# Patient Record
Sex: Female | Born: 1980 | Race: Black or African American | Hispanic: No | Marital: Married | Smoking: Never smoker
Health system: Southern US, Community
[De-identification: ages and names within clinical notes are randomized; demographics above are authoritative.]

## PROBLEM LIST (undated history)

## (undated) DIAGNOSIS — K831 Obstruction of bile duct: Secondary | ICD-10-CM

## (undated) DIAGNOSIS — O26619 Liver and biliary tract disorders in pregnancy, unspecified trimester: Secondary | ICD-10-CM

## (undated) HISTORY — DX: Obstruction of bile duct: K83.1

## (undated) HISTORY — DX: Liver and biliary tract disorders in pregnancy, unspecified trimester: O26.619

---

## 1999-03-12 ENCOUNTER — Other Ambulatory Visit: Admission: RE | Admit: 1999-03-12 | Discharge: 1999-03-12 | Payer: Self-pay | Admitting: Family Medicine

## 2000-11-05 ENCOUNTER — Encounter: Payer: Self-pay | Admitting: Infectious Diseases

## 2000-11-05 ENCOUNTER — Ambulatory Visit (HOSPITAL_COMMUNITY): Admission: RE | Admit: 2000-11-05 | Discharge: 2000-11-05 | Payer: Self-pay | Admitting: Infectious Diseases

## 2003-02-23 ENCOUNTER — Other Ambulatory Visit: Admission: RE | Admit: 2003-02-23 | Discharge: 2003-02-23 | Payer: Self-pay | Admitting: Family Medicine

## 2004-02-19 ENCOUNTER — Other Ambulatory Visit: Admission: RE | Admit: 2004-02-19 | Discharge: 2004-02-19 | Payer: Self-pay | Admitting: Obstetrics and Gynecology

## 2008-12-26 ENCOUNTER — Inpatient Hospital Stay (HOSPITAL_COMMUNITY): Admission: AD | Admit: 2008-12-26 | Discharge: 2008-12-26 | Payer: Self-pay | Admitting: Obstetrics and Gynecology

## 2009-01-26 HISTORY — PX: CHOLECYSTECTOMY: SHX55

## 2009-03-08 ENCOUNTER — Inpatient Hospital Stay (HOSPITAL_COMMUNITY): Admission: AD | Admit: 2009-03-08 | Discharge: 2009-03-10 | Payer: Self-pay | Admitting: Obstetrics and Gynecology

## 2009-08-20 ENCOUNTER — Encounter: Admission: RE | Admit: 2009-08-20 | Discharge: 2009-08-20 | Payer: Self-pay | Admitting: Family Medicine

## 2009-10-24 ENCOUNTER — Ambulatory Visit (HOSPITAL_COMMUNITY)
Admission: RE | Admit: 2009-10-24 | Discharge: 2009-10-24 | Payer: Self-pay | Source: Home / Self Care | Admitting: Gastroenterology

## 2009-12-03 ENCOUNTER — Ambulatory Visit (HOSPITAL_COMMUNITY)
Admission: RE | Admit: 2009-12-03 | Discharge: 2009-12-03 | Payer: Self-pay | Source: Home / Self Care | Admitting: General Surgery

## 2010-04-08 LAB — DIFFERENTIAL
Basophils Absolute: 0 10*3/uL (ref 0.0–0.1)
Lymphocytes Relative: 26 % (ref 12–46)
Monocytes Absolute: 0.4 10*3/uL (ref 0.1–1.0)
Neutro Abs: 5.5 10*3/uL (ref 1.7–7.7)
Neutrophils Relative %: 67 % (ref 43–77)

## 2010-04-08 LAB — COMPREHENSIVE METABOLIC PANEL
Albumin: 4.1 g/dL (ref 3.5–5.2)
BUN: 7 mg/dL (ref 6–23)
Creatinine, Ser: 0.69 mg/dL (ref 0.4–1.2)
Total Bilirubin: 0.4 mg/dL (ref 0.3–1.2)
Total Protein: 7.7 g/dL (ref 6.0–8.3)

## 2010-04-08 LAB — CBC
MCH: 28 pg (ref 26.0–34.0)
MCV: 83.9 fL (ref 78.0–100.0)
Platelets: 373 10*3/uL (ref 150–400)
RDW: 13.8 % (ref 11.5–15.5)

## 2010-04-08 LAB — SURGICAL PCR SCREEN: MRSA, PCR: NEGATIVE

## 2010-04-17 LAB — CBC
HCT: 34.6 % — ABNORMAL LOW (ref 36.0–46.0)
Hemoglobin: 11.3 g/dL — ABNORMAL LOW (ref 12.0–15.0)
MCV: 83.6 fL (ref 78.0–100.0)
RBC: 4.14 MIL/uL (ref 3.87–5.11)
RBC: 4.71 MIL/uL (ref 3.87–5.11)
RDW: 16.3 % — ABNORMAL HIGH (ref 11.5–15.5)
WBC: 11.8 10*3/uL — ABNORMAL HIGH (ref 4.0–10.5)
WBC: 13.2 10*3/uL — ABNORMAL HIGH (ref 4.0–10.5)

## 2010-04-17 LAB — RH IMMUNE GLOB WKUP(>/=20WKS)(NOT WOMEN'S HOSP): Fetal Screen: NEGATIVE

## 2010-04-17 LAB — RPR: RPR Ser Ql: NONREACTIVE

## 2010-04-29 LAB — DIFFERENTIAL
Basophils Absolute: 0 10*3/uL (ref 0.0–0.1)
Basophils Relative: 0 % (ref 0–1)
Eosinophils Absolute: 0 10*3/uL (ref 0.0–0.7)
Monocytes Relative: 6 % (ref 3–12)
Neutro Abs: 12.8 10*3/uL — ABNORMAL HIGH (ref 1.7–7.7)
Neutrophils Relative %: 85 % — ABNORMAL HIGH (ref 43–77)

## 2010-04-29 LAB — URINALYSIS, ROUTINE W REFLEX MICROSCOPIC
Bilirubin Urine: NEGATIVE
Ketones, ur: NEGATIVE mg/dL
Nitrite: NEGATIVE
Protein, ur: NEGATIVE mg/dL
Urobilinogen, UA: 0.2 mg/dL (ref 0.0–1.0)

## 2010-04-29 LAB — CBC
MCHC: 33.1 g/dL (ref 30.0–36.0)
MCV: 86.7 fL (ref 78.0–100.0)
RDW: 13.3 % (ref 11.5–15.5)

## 2010-05-15 ENCOUNTER — Other Ambulatory Visit: Payer: Self-pay | Admitting: Obstetrics and Gynecology

## 2011-08-20 ENCOUNTER — Other Ambulatory Visit: Payer: Self-pay | Admitting: Physician Assistant

## 2012-03-09 IMAGING — US US ABDOMEN COMPLETE
1 series · 14 of 25 positions shown · non-contrast
Comparison: None.

CLINICAL DATA: Elevated liver function tests, abdominal pain

COMPLETE ABDOMINAL ULTRASOUND

[Series 1: us abdomen complete · 0.25mm/px · 14 of 83 slices shown]
[im 1/83]
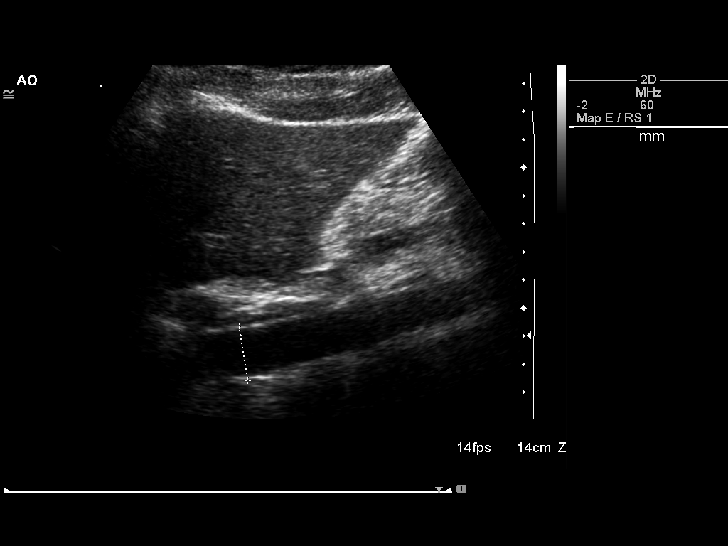
[im 7/83]
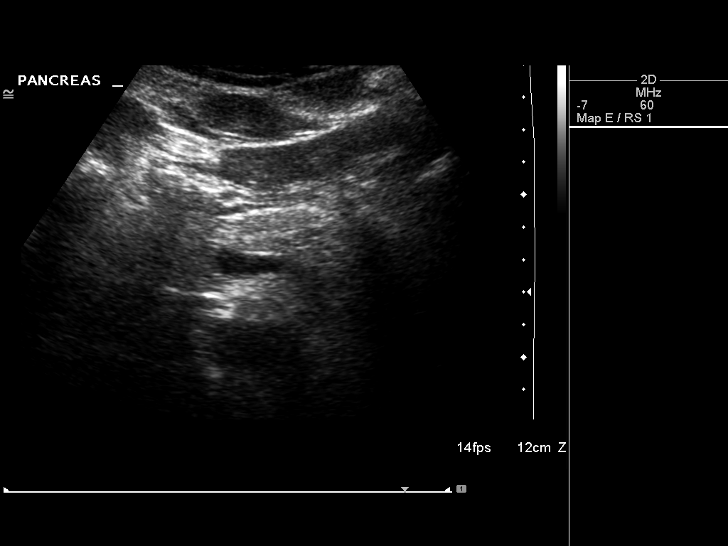
[im 14/83]
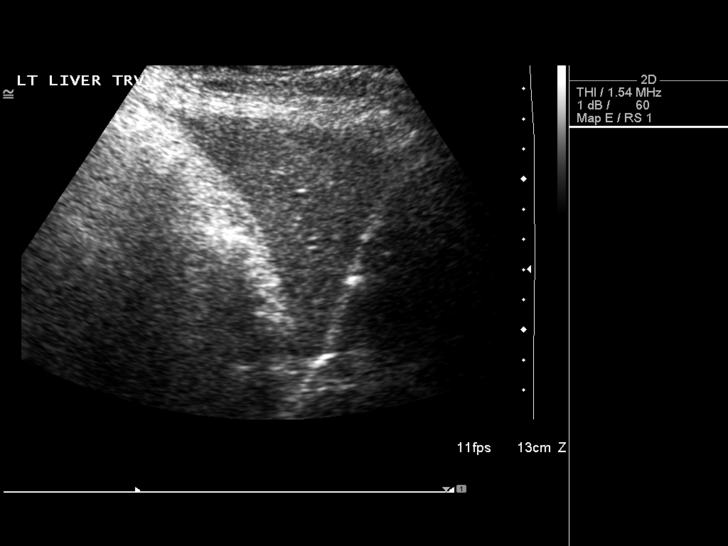
[im 21/83]
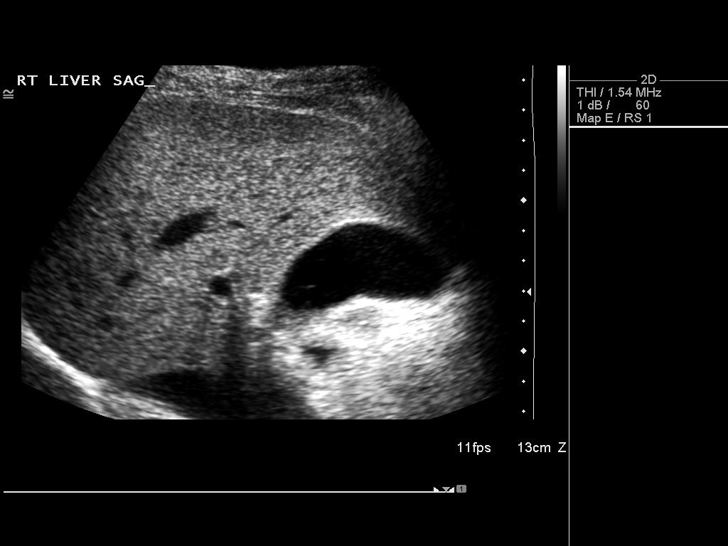
[im 28/83]
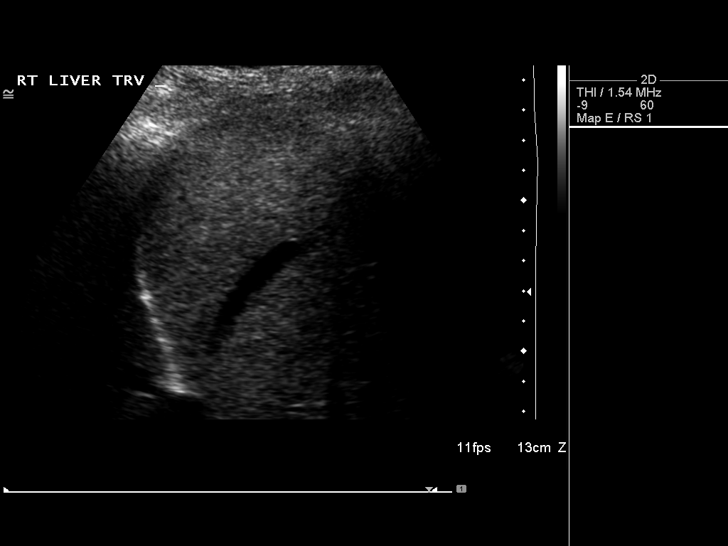
[im 31/83]
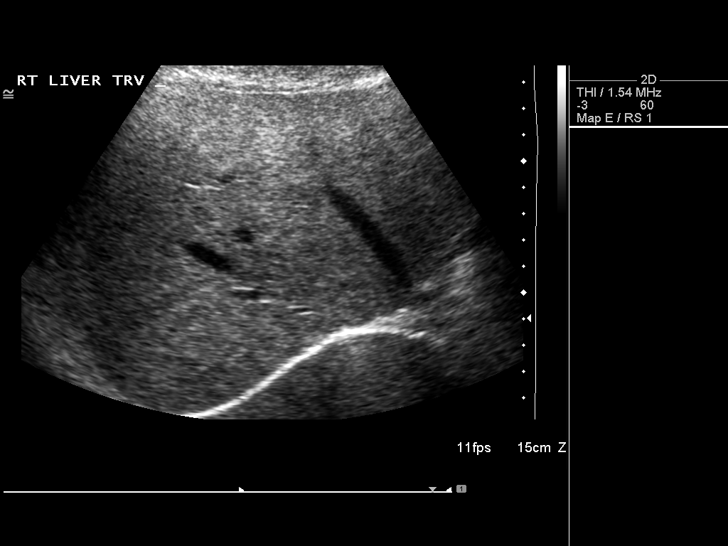
[im 38/83]
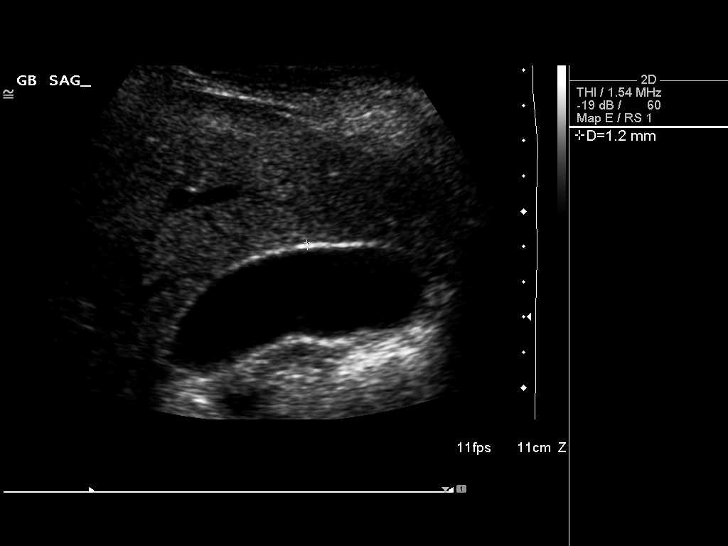
[im 45/83]
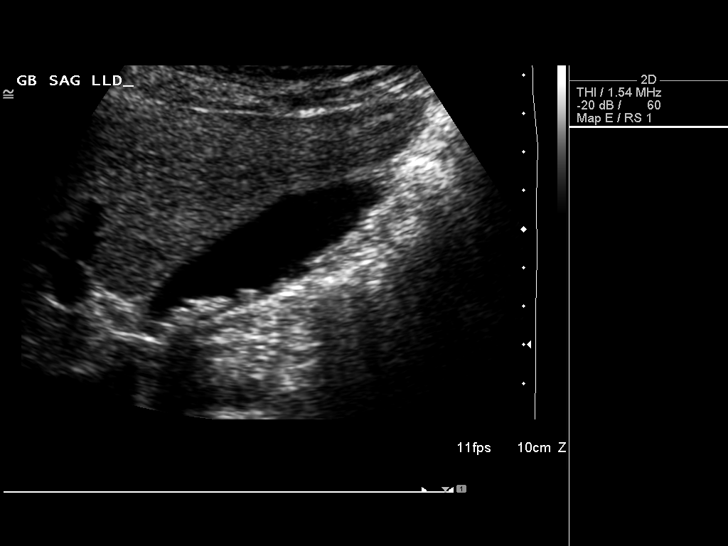
[im 52/83]
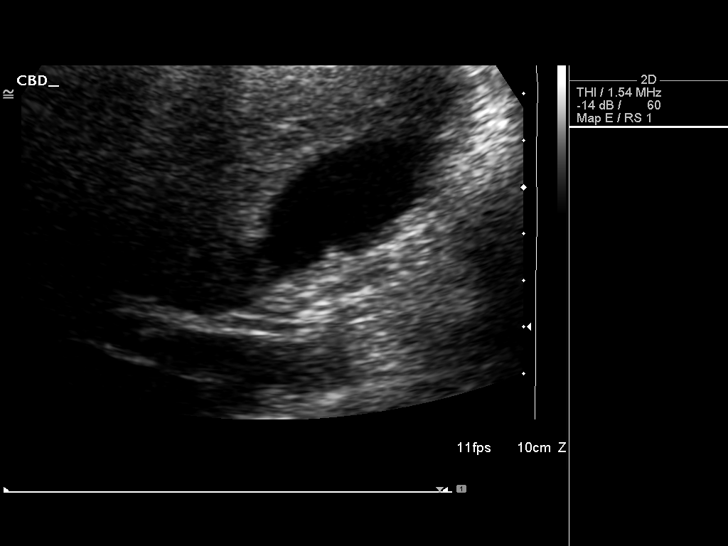
[im 55/83]
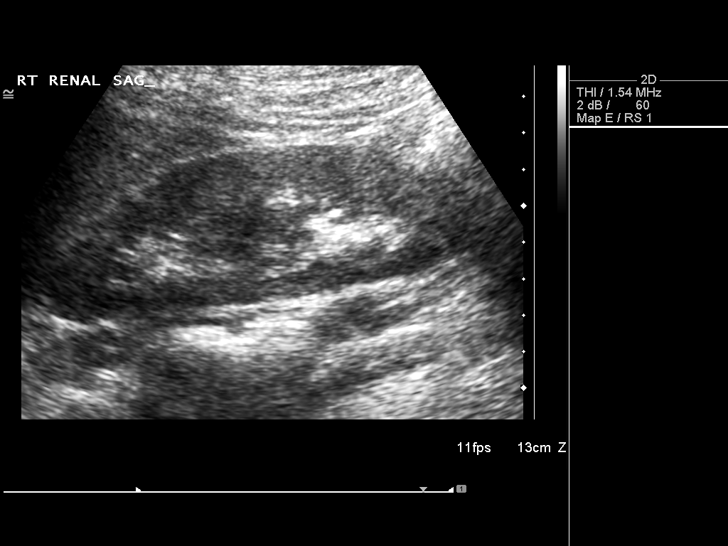
[im 62/83]
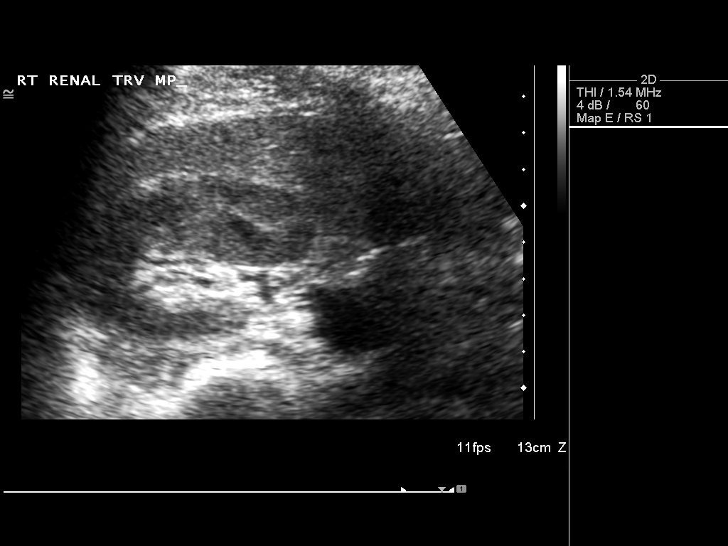
[im 69/83]
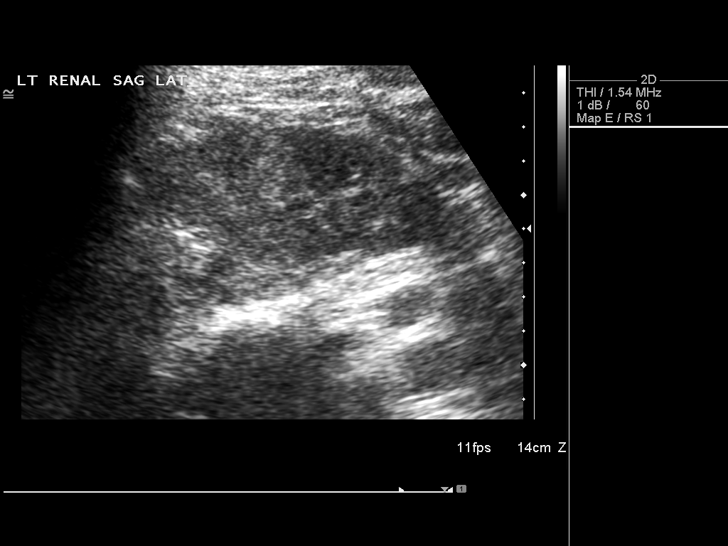
[im 76/83]
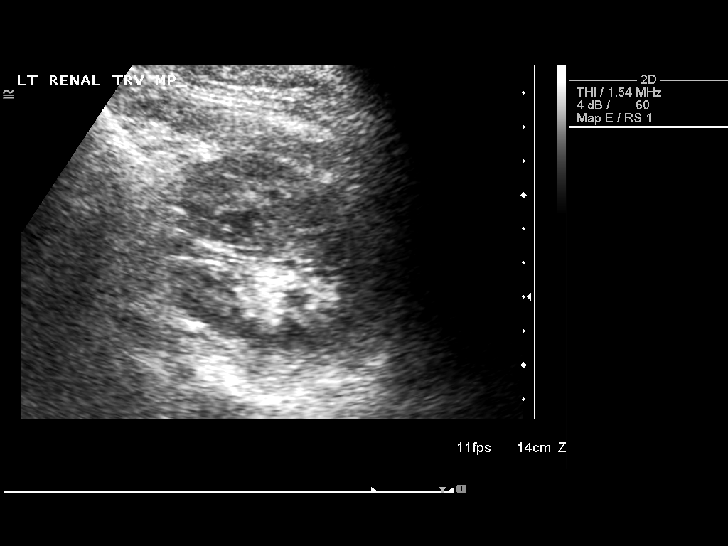
[im 83/83]
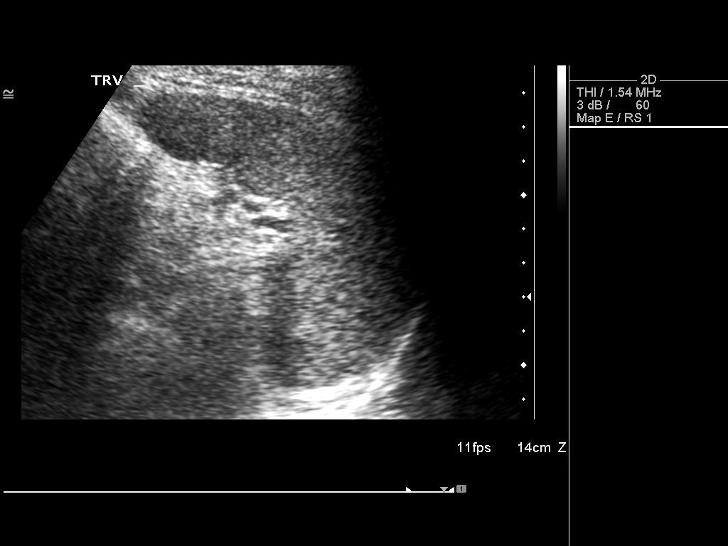

[14 of 25 positions shown; findings below may reference images not displayed]

FINDINGS: Gallbladder:  The gallbladder is visualized and multiple gallstones
are noted within the gallbladder.  No pain is present over the
gallbladder with compression and no gallbladder wall thickening is
seen.

Common bile duct:  The common bile duct is normal measuring 2.8 mm
in diameter.

Liver:  The liver has a normal echogenic pattern.  No ductal
dilatation is seen.

IVC:  Appears normal.

Pancreas:  No focal abnormality seen.

Spleen:  The spleen is normal measuring 9.0 cm sagittally.

Right Kidney:  No hydronephrosis is seen.  The right kidney
measures 12.1 cm sagittally.

Left Kidney:  No hydronephrosis.  The left kidney measures 11.5 cm

Abdominal aorta:  It the abdominal aorta is normal in caliber.
IMPRESSION: 1.  Multiple gallstones.  No pain over gallbladder.
2.  No ductal dilatation.

## 2013-01-26 HISTORY — PX: ANTERIOR CRUCIATE LIGAMENT REPAIR: SHX115

## 2013-07-20 ENCOUNTER — Other Ambulatory Visit: Payer: Self-pay | Admitting: Obstetrics and Gynecology

## 2013-07-24 LAB — CYTOLOGY - PAP

## 2015-01-27 NOTE — L&D Delivery Note (Signed)
Patient was C/C/+1 and pushed for 45 minutes with epidural.   NSVD  female infant, Apgars 9,9, weight P.   The patient had no lacerations. Fundus was firm. EBL was expected amount. Placenta was delivered intact. Vagina was clear.  Baby was vigorous and doing skin to skin with mother.  Layanna Charo A

## 2015-01-31 ENCOUNTER — Other Ambulatory Visit: Payer: Self-pay | Admitting: Obstetrics and Gynecology

## 2015-01-31 LAB — OB RESULTS CONSOLE HIV ANTIBODY (ROUTINE TESTING): HIV: NONREACTIVE

## 2015-01-31 LAB — OB RESULTS CONSOLE RPR: RPR: NONREACTIVE

## 2015-01-31 LAB — OB RESULTS CONSOLE ABO/RH: RH Type: NEGATIVE

## 2015-01-31 LAB — OB RESULTS CONSOLE HEPATITIS B SURFACE ANTIGEN: HEP B S AG: NEGATIVE

## 2015-01-31 LAB — OB RESULTS CONSOLE GC/CHLAMYDIA
CHLAMYDIA, DNA PROBE: NEGATIVE
Gonorrhea: NEGATIVE

## 2015-01-31 LAB — OB RESULTS CONSOLE ANTIBODY SCREEN: ANTIBODY SCREEN: NEGATIVE

## 2015-01-31 LAB — OB RESULTS CONSOLE RUBELLA ANTIBODY, IGM: RUBELLA: IMMUNE

## 2015-02-04 LAB — CYTOLOGY - PAP

## 2015-07-19 ENCOUNTER — Other Ambulatory Visit: Payer: Self-pay | Admitting: Obstetrics and Gynecology

## 2015-07-19 LAB — OB RESULTS CONSOLE GBS: GBS: POSITIVE

## 2015-07-23 ENCOUNTER — Other Ambulatory Visit: Payer: Self-pay | Admitting: Obstetrics and Gynecology

## 2015-07-24 ENCOUNTER — Encounter (HOSPITAL_COMMUNITY): Payer: Self-pay | Admitting: General Practice

## 2015-07-24 ENCOUNTER — Telehealth (HOSPITAL_COMMUNITY): Payer: Self-pay | Admitting: General Practice

## 2015-07-24 NOTE — Telephone Encounter (Signed)
Preadmission screen  

## 2015-07-26 ENCOUNTER — Telehealth (HOSPITAL_COMMUNITY): Payer: Self-pay | Admitting: *Deleted

## 2015-07-26 ENCOUNTER — Encounter (HOSPITAL_COMMUNITY): Payer: Self-pay | Admitting: *Deleted

## 2015-07-26 NOTE — Telephone Encounter (Signed)
Preadmission screen  

## 2015-07-31 ENCOUNTER — Inpatient Hospital Stay (HOSPITAL_COMMUNITY): Admission: RE | Admit: 2015-07-31 | Payer: Self-pay | Source: Ambulatory Visit | Admitting: Obstetrics and Gynecology

## 2015-07-31 ENCOUNTER — Inpatient Hospital Stay (HOSPITAL_COMMUNITY): Payer: BLUE CROSS/BLUE SHIELD | Admitting: Anesthesiology

## 2015-07-31 ENCOUNTER — Inpatient Hospital Stay (HOSPITAL_COMMUNITY)
Admission: RE | Admit: 2015-07-31 | Discharge: 2015-08-01 | DRG: 775 | Disposition: A | Discharge status: Home / Self Care | Payer: BLUE CROSS/BLUE SHIELD | Source: Ambulatory Visit | Attending: Obstetrics and Gynecology | Admitting: Obstetrics and Gynecology

## 2015-07-31 ENCOUNTER — Encounter (HOSPITAL_COMMUNITY): Payer: Self-pay

## 2015-07-31 DIAGNOSIS — Z3A37 37 weeks gestation of pregnancy: Secondary | ICD-10-CM

## 2015-07-31 DIAGNOSIS — K831 Obstruction of bile duct: Secondary | ICD-10-CM | POA: Diagnosis present

## 2015-07-31 DIAGNOSIS — O2662 Liver and biliary tract disorders in childbirth: Secondary | ICD-10-CM | POA: Diagnosis present

## 2015-07-31 DIAGNOSIS — Z3490 Encounter for supervision of normal pregnancy, unspecified, unspecified trimester: Secondary | ICD-10-CM

## 2015-07-31 DIAGNOSIS — O99824 Streptococcus B carrier state complicating childbirth: Secondary | ICD-10-CM | POA: Diagnosis present

## 2015-07-31 LAB — CBC
HCT: 34.6 % — ABNORMAL LOW (ref 36.0–46.0)
Hemoglobin: 11.3 g/dL — ABNORMAL LOW (ref 12.0–15.0)
MCH: 25.4 pg — ABNORMAL LOW (ref 26.0–34.0)
MCHC: 32.7 g/dL (ref 30.0–36.0)
MCV: 77.8 fL — AB (ref 78.0–100.0)
PLATELETS: 400 10*3/uL (ref 150–400)
RBC: 4.45 MIL/uL (ref 3.87–5.11)
RDW: 15.4 % (ref 11.5–15.5)
WBC: 10.6 10*3/uL — AB (ref 4.0–10.5)

## 2015-07-31 MED ORDER — BENZOCAINE-MENTHOL 20-0.5 % EX AERO: 1.0000 "application " | INHALATION_SPRAY | CUTANEOUS | Status: DC | PRN

## 2015-07-31 MED ORDER — ONDANSETRON HCL 4 MG PO TABS: 4.0000 mg | ORAL_TABLET | ORAL | Status: DC | PRN

## 2015-07-31 MED ORDER — LACTATED RINGERS IV SOLN: 500.0000 mL | Freq: Once | INTRAVENOUS | Status: DC

## 2015-07-31 MED ORDER — PHENYLEPHRINE 40 MCG/ML (10ML) SYRINGE FOR IV PUSH (FOR BLOOD PRESSURE SUPPORT)
80.0000 ug | PREFILLED_SYRINGE | INTRAVENOUS | Status: DC | PRN
Start: 2015-07-31 — End: 2015-07-31
  Filled 2015-07-31: qty 10
  Filled 2015-07-31: qty 5

## 2015-07-31 MED ORDER — MAGNESIUM HYDROXIDE 400 MG/5ML PO SUSP: 30.0000 mL | ORAL | Status: DC | PRN

## 2015-07-31 MED ORDER — OXYTOCIN 40 UNITS IN LACTATED RINGERS INFUSION - SIMPLE MED
1.0000 m[IU]/min | INTRAVENOUS | Status: DC
  Administered 2015-07-31: 1 m[IU]/min via INTRAVENOUS
  Filled 2015-07-31: qty 1000

## 2015-07-31 MED ORDER — FENTANYL 2.5 MCG/ML BUPIVACAINE 1/10 % EPIDURAL INFUSION (WH - ANES)
14.0000 mL/h | INTRAMUSCULAR | Status: DC | PRN
  Administered 2015-07-31: 14 mL/h via EPIDURAL
  Filled 2015-07-31: qty 125

## 2015-07-31 MED ORDER — OXYCODONE-ACETAMINOPHEN 5-325 MG PO TABS: 2.0000 | ORAL_TABLET | ORAL | Status: DC | PRN

## 2015-07-31 MED ORDER — DIBUCAINE 1 % RE OINT: 1.0000 "application " | TOPICAL_OINTMENT | RECTAL | Status: DC | PRN

## 2015-07-31 MED ORDER — OXYCODONE-ACETAMINOPHEN 5-325 MG PO TABS: 1.0000 | ORAL_TABLET | ORAL | Status: DC | PRN

## 2015-07-31 MED ORDER — TETANUS-DIPHTH-ACELL PERTUSSIS 5-2.5-18.5 LF-MCG/0.5 IM SUSP: 0.5000 mL | Freq: Once | INTRAMUSCULAR | Status: DC

## 2015-07-31 MED ORDER — DIPHENHYDRAMINE HCL 50 MG/ML IJ SOLN: 12.5000 mg | INTRAMUSCULAR | Status: DC | PRN

## 2015-07-31 MED ORDER — BUTORPHANOL TARTRATE 1 MG/ML IJ SOLN
INTRAMUSCULAR | Status: AC
  Filled 2015-07-31: qty 1

## 2015-07-31 MED ORDER — IBUPROFEN 800 MG PO TABS
800.0000 mg | ORAL_TABLET | Freq: Three times a day (TID) | ORAL | Status: DC
  Administered 2015-07-31 – 2015-08-01 (×4): 800 mg via ORAL
  Filled 2015-07-31 (×4): qty 1

## 2015-07-31 MED ORDER — PHENYLEPHRINE 40 MCG/ML (10ML) SYRINGE FOR IV PUSH (FOR BLOOD PRESSURE SUPPORT)
80.0000 ug | PREFILLED_SYRINGE | INTRAVENOUS | Status: DC | PRN
  Administered 2015-07-31: 80 ug via INTRAVENOUS
  Filled 2015-07-31: qty 5

## 2015-07-31 MED ORDER — PRENATAL MULTIVITAMIN CH: 1.0000 | ORAL_TABLET | Freq: Every day | ORAL | Status: DC

## 2015-07-31 MED ORDER — ONDANSETRON HCL 4 MG/2ML IJ SOLN: 4.0000 mg | Freq: Four times a day (QID) | INTRAMUSCULAR | Status: DC | PRN

## 2015-07-31 MED ORDER — SOD CITRATE-CITRIC ACID 500-334 MG/5ML PO SOLN: 30.0000 mL | ORAL | Status: DC | PRN

## 2015-07-31 MED ORDER — SENNOSIDES-DOCUSATE SODIUM 8.6-50 MG PO TABS
2.0000 | ORAL_TABLET | ORAL | Status: DC
  Administered 2015-08-01: 1 via ORAL
  Filled 2015-07-31: qty 2

## 2015-07-31 MED ORDER — LIDOCAINE HCL (PF) 1 % IJ SOLN
30.0000 mL | INTRAMUSCULAR | Status: DC | PRN
  Filled 2015-07-31: qty 30

## 2015-07-31 MED ORDER — LIDOCAINE HCL (PF) 1 % IJ SOLN
INTRAMUSCULAR | Status: DC | PRN
  Administered 2015-07-31: 5 mL via EPIDURAL
  Administered 2015-07-31: 3 mL via EPIDURAL
  Administered 2015-07-31: 2 mL via EPIDURAL

## 2015-07-31 MED ORDER — COCONUT OIL OIL: 1.0000 "application " | TOPICAL_OIL | Status: DC | PRN

## 2015-07-31 MED ORDER — LACTATED RINGERS IV SOLN: 500.0000 mL | INTRAVENOUS | Status: DC | PRN

## 2015-07-31 MED ORDER — OXYTOCIN 40 UNITS IN LACTATED RINGERS INFUSION - SIMPLE MED: 2.5000 [IU]/h | INTRAVENOUS | Status: DC

## 2015-07-31 MED ORDER — TERBUTALINE SULFATE 1 MG/ML IJ SOLN
0.2500 mg | Freq: Once | INTRAMUSCULAR | Status: DC | PRN
  Filled 2015-07-31: qty 1

## 2015-07-31 MED ORDER — ZOLPIDEM TARTRATE 5 MG PO TABS: 5.0000 mg | ORAL_TABLET | Freq: Every evening | ORAL | Status: DC | PRN

## 2015-07-31 MED ORDER — LACTATED RINGERS IV SOLN
500.0000 mL | Freq: Once | INTRAVENOUS | Status: AC
  Administered 2015-07-31: 500 mL via INTRAVENOUS

## 2015-07-31 MED ORDER — EPHEDRINE 5 MG/ML INJ
10.0000 mg | INTRAVENOUS | Status: DC | PRN
  Filled 2015-07-31: qty 2

## 2015-07-31 MED ORDER — BUTORPHANOL TARTRATE 1 MG/ML IJ SOLN
1.0000 mg | INTRAMUSCULAR | Status: DC | PRN
  Administered 2015-07-31: 1 mg via INTRAVENOUS

## 2015-07-31 MED ORDER — SODIUM CHLORIDE 0.9 % IV SOLN: 250.0000 mL | INTRAVENOUS | Status: DC | PRN

## 2015-07-31 MED ORDER — MEASLES, MUMPS & RUBELLA VAC ~~LOC~~ INJ
0.5000 mL | INJECTION | Freq: Once | SUBCUTANEOUS | Status: DC
  Filled 2015-07-31: qty 0.5

## 2015-07-31 MED ORDER — PENICILLIN G POTASSIUM 5000000 UNITS IJ SOLR
5.0000 10*6.[IU] | Freq: Once | INTRAVENOUS | Status: AC
  Administered 2015-07-31: 5 10*6.[IU] via INTRAVENOUS
  Filled 2015-07-31: qty 5

## 2015-07-31 MED ORDER — ONDANSETRON HCL 4 MG/2ML IJ SOLN: 4.0000 mg | INTRAMUSCULAR | Status: DC | PRN

## 2015-07-31 MED ORDER — OXYTOCIN 40 UNITS IN LACTATED RINGERS INFUSION - SIMPLE MED: 1.0000 m[IU]/min | INTRAVENOUS | Status: DC

## 2015-07-31 MED ORDER — METHYLERGONOVINE MALEATE 0.2 MG/ML IJ SOLN: 0.2000 mg | INTRAMUSCULAR | Status: DC | PRN

## 2015-07-31 MED ORDER — DIPHENHYDRAMINE HCL 25 MG PO CAPS: 25.0000 mg | ORAL_CAPSULE | Freq: Four times a day (QID) | ORAL | Status: DC | PRN

## 2015-07-31 MED ORDER — WITCH HAZEL-GLYCERIN EX PADS: 1.0000 "application " | MEDICATED_PAD | CUTANEOUS | Status: DC | PRN

## 2015-07-31 MED ORDER — METHYLERGONOVINE MALEATE 0.2 MG PO TABS: 0.2000 mg | ORAL_TABLET | ORAL | Status: DC | PRN

## 2015-07-31 MED ORDER — PENICILLIN G POTASSIUM 5000000 UNITS IJ SOLR
2.5000 10*6.[IU] | INTRAVENOUS | Status: DC
  Administered 2015-07-31: 2.5 10*6.[IU] via INTRAVENOUS
  Filled 2015-07-31 (×6): qty 2.5

## 2015-07-31 MED ORDER — FLEET ENEMA 7-19 GM/118ML RE ENEM: 1.0000 | ENEMA | RECTAL | Status: DC | PRN

## 2015-07-31 MED ORDER — ACETAMINOPHEN 325 MG PO TABS: 650.0000 mg | ORAL_TABLET | ORAL | Status: DC | PRN

## 2015-07-31 MED ORDER — SODIUM CHLORIDE 0.9% FLUSH
3.0000 mL | Freq: Two times a day (BID) | INTRAVENOUS | Status: DC
  Administered 2015-08-01: 3 mL via INTRAVENOUS

## 2015-07-31 MED ORDER — OXYTOCIN BOLUS FROM INFUSION
500.0000 mL | INTRAVENOUS | Status: DC
  Administered 2015-07-31: 500 mL via INTRAVENOUS

## 2015-07-31 MED ORDER — LACTATED RINGERS IV SOLN
INTRAVENOUS | Status: DC
  Administered 2015-07-31: 08:00:00 via INTRAVENOUS

## 2015-07-31 MED ORDER — ACETAMINOPHEN 325 MG PO TABS
650.0000 mg | ORAL_TABLET | ORAL | Status: DC | PRN
Start: 2015-07-31 — End: 2015-07-31

## 2015-07-31 MED ORDER — SIMETHICONE 80 MG PO CHEW: 80.0000 mg | CHEWABLE_TABLET | ORAL | Status: DC | PRN

## 2015-07-31 MED ORDER — SODIUM CHLORIDE 0.9% FLUSH: 3.0000 mL | INTRAVENOUS | Status: DC | PRN

## 2015-07-31 MED ORDER — FERROUS SULFATE 325 (65 FE) MG PO TABS
325.0000 mg | ORAL_TABLET | Freq: Two times a day (BID) | ORAL | Status: DC
  Administered 2015-08-01 (×2): 325 mg via ORAL
  Filled 2015-07-31 (×2): qty 1

## 2015-07-31 NOTE — H&P (Signed)
35 y.o. 4970w4d  G2P1001 comes in for induction at term for cholestasis of pregnancy.  Otherwise has good fetal movement and no bleeding.  Past Medical History  Diagnosis Date  . Cholestasis of pregnancy     Past Surgical History  Procedure Laterality Date  . Anterior cruciate ligament repair  2015  . Cholecystectomy  2011    OB History  Gravida Para Term Preterm AB SAB TAB Ectopic Multiple Living  2 1 1  0 0 0 0 0 0 1    # Outcome Date GA Lbr Len/2nd Weight Sex Delivery Anes PTL Lv  2 Current           1 Term 2011 2859w0d  2.835 kg (6 lb 4 oz)   EPI N       Social History   Social History  . Marital Status: Married    Spouse Name: N/A  . Number of Children: N/A  . Years of Education: N/A   Occupational History  . Not on file.   Social History Main Topics  . Smoking status: Never Smoker   . Smokeless tobacco: Never Used  . Alcohol Use: No  . Drug Use: No  . Sexual Activity: Yes   Other Topics Concern  . Not on file   Social History Narrative   Shellfish-derived products    Prenatal Transfer Tool  Maternal Diabetes: No Genetic Screening: Normal Maternal Ultrasounds/Referrals: Normal Fetal Ultrasounds or other Referrals:  None Maternal Substance Abuse:  No Significant Maternal Medications:  Meds include: Other:  Significant Maternal Lab Results: Lab values include: Other:   Other PNC: AMA with normal NIPT.  Passed 3 hour GTT.  Pt dx with cholestasis of pregnancy at 31 weeks with elevated bile acids but normal LFTs.  Pt's sx have been stable and growth of fetus as expected.  Pt took ursodiol irregularly.    There were no vitals filed for this visit.   Lungs/Cor:  NAD Abdomen:  soft, gravid Ex:  no cords, erythema SVE:  3/60/-2 FHTs:  150s, good STV, NST R Toco:  q 3-4   A/P   Term AMA with cholestasis of pregnancy; for induction.   GBS Pos- PCN.  Sacha Topor A mhorvat

## 2015-07-31 NOTE — Anesthesia Procedure Notes (Signed)
Epidural Patient location during procedure: OB  Staffing Anesthesiologist: Cecile HearingURK, Zonnie Landen EDWARD Performed by: anesthesiologist   Preanesthetic Checklist Completed: patient identified, pre-op evaluation, timeout performed, IV checked, risks and benefits discussed and monitors and equipment checked  Epidural Patient position: sitting Prep: DuraPrep Patient monitoring: blood pressure and continuous pulse ox Approach: midline Location: L3-L4 Injection technique: LOR air  Needle:  Needle type: Tuohy  Needle gauge: 17 G Needle length: 9 cm Needle insertion depth: 5 cm Catheter size: 19 Gauge Catheter at skin depth: 9 cm Test dose: negative and Other (1% Lidocaine)  Additional Notes Patient identified.  Risk benefits discussed including failed block, incomplete pain control, headache, nerve damage, paralysis, blood pressure changes, nausea, vomiting, reactions to medication both toxic or allergic, and postpartum back pain.  Patient expressed understanding and wished to proceed.  All questions were answered.  Sterile technique used throughout procedure and epidural site dressed with sterile barrier dressing. No paresthesia or other complications noted. The patient did not experience any signs of intravascular injection such as tinnitus or metallic taste in mouth nor signs of intrathecal spread such as rapid motor block. Please see nursing notes for vital signs. Reason for block:procedure for pain

## 2015-07-31 NOTE — Anesthesia Pain Management Evaluation Note (Signed)
  CRNA Pain Management Visit Note  Patient: Ashlee DillonDionka Anderson, 35 y.o., female  "Hello I am a member of the anesthesia [facility] at [facility]. We have an anesthesia team available at all times to provide care throughout the hospital, including epidural management and anesthesia for C-section. I don't know your plan for the delivery whether it a natural birth, water birth, IV sedation, nitrous supplementation, doula or epidural, but we want to meet your pain goals."   1.Was your pain managed to your expectations on prior hospitalizations?   Yes   2.What is your expectation for pain management during this hospitalization?     Labor support without medications, Epidural and IV pain meds  3.How can we help you reach that goal? Pt had epidural with last delivery that worked well, but would liek to see how far she can get this time with desire not to have one. She was still open to discussion and idea of epidural if needed.   Record the patient's initial score and the patient's pain goal.   Pain: 8  Pain Goal: 10 [facility] wants you to be able to say your pain was always managed very well.  Ashlee Anderson 07/31/2015

## 2015-07-31 NOTE — Anesthesia Preprocedure Evaluation (Signed)
Anesthesia Evaluation  Patient identified by MRN, date of birth, ID band Patient awake    Reviewed: Allergy & Precautions, NPO status , Patient's Chart, lab work & pertinent test results  Airway Mallampati: II  TM Distance: >3 FB Neck ROM: Full    Dental  (+) Teeth Intact, Dental Advisory Given   Pulmonary neg pulmonary ROS,    Pulmonary exam normal breath sounds clear to auscultation       Cardiovascular negative cardio ROS Normal cardiovascular exam Rhythm:Regular Rate:Normal     Neuro/Psych negative neurological ROS  negative psych ROS   GI/Hepatic negative GI ROS, Neg liver ROS, Cholestasis of pregnancy    Endo/Other  negative endocrine ROSObesity   Renal/GU negative Renal ROS     Musculoskeletal negative musculoskeletal ROS (+)   Abdominal   Peds  Hematology  (+) Blood dyscrasia, anemia , Plt 400k   Anesthesia Other Findings Day of surgery medications reviewed with the patient.  Reproductive/Obstetrics (+) Pregnancy                             Anesthesia Physical Anesthesia Plan  ASA: II  Anesthesia Plan: Epidural   Post-op Pain Management:    Induction:   Airway Management Planned:   Additional Equipment:   Intra-op Plan:   Post-operative Plan:   Informed Consent: I have reviewed the patients History and Physical, chart, labs and discussed the procedure including the risks, benefits and alternatives for the proposed anesthesia with the patient or authorized representative who has indicated his/her understanding and acceptance.   Dental advisory given  Plan Discussed with:   Anesthesia Plan Comments: (Patient identified. Risks/Benefits/Options discussed with patient including but not limited to bleeding, infection, nerve damage, paralysis, failed block, incomplete pain control, headache, blood pressure changes, nausea, vomiting, reactions to medication both or  allergic, itching and postpartum back pain. Confirmed with bedside nurse the patient's most recent platelet count. Confirmed with patient that they are not currently taking any anticoagulation, have any bleeding history or any family history of bleeding disorders. Patient expressed understanding and wished to proceed. All questions were answered. )        Anesthesia Quick Evaluation

## 2015-07-31 NOTE — Anesthesia Postprocedure Evaluation (Signed)
Anesthesia Post Note  Patient: Ashlee Anderson  Procedure(s) Performed: * No procedures listed *  Patient location during evaluation: Mother Baby Anesthesia Type: Epidural Level of consciousness: awake and alert Pain management: satisfactory to patient Vital Signs Assessment: post-procedure vital signs reviewed and stable Respiratory status: respiratory function stable Cardiovascular status: stable Postop Assessment: no headache, no backache, epidural receding, patient able to bend at knees, no signs of nausea or vomiting and adequate PO intake Anesthetic complications: no Comments: Comfort level was assessed by AnesthesiaTeam and the patient was pleased with the care, interventions, and services provided by the Department of Anesthesia.     Last Vitals:  Filed Vitals:   07/31/15 1703 07/31/15 1715  BP: 139/99 128/74  Pulse: 115 84  Temp:  36.8 C  Resp:  20    Last Pain:  Filed Vitals:   07/31/15 1741  PainSc: 0-No pain   Pain Goal: Patients Stated Pain Goal: 5 (07/31/15 1031)               Desi Carby

## 2015-08-01 LAB — CBC
HCT: 31.2 % — ABNORMAL LOW (ref 36.0–46.0)
Hemoglobin: 10.3 g/dL — ABNORMAL LOW (ref 12.0–15.0)
MCH: 25.2 pg — ABNORMAL LOW (ref 26.0–34.0)
MCHC: 33 g/dL (ref 30.0–36.0)
MCV: 76.5 fL — AB (ref 78.0–100.0)
Platelets: 370 10*3/uL (ref 150–400)
RBC: 4.08 MIL/uL (ref 3.87–5.11)
RDW: 15.5 % (ref 11.5–15.5)
WBC: 13.7 10*3/uL — AB (ref 4.0–10.5)

## 2015-08-01 LAB — RPR: RPR Ser Ql: NONREACTIVE

## 2015-08-01 MED ORDER — IBUPROFEN 800 MG PO TABS: 800.0000 mg | ORAL_TABLET | Freq: Three times a day (TID) | ORAL | Status: AC

## 2015-08-01 MED ORDER — RHO D IMMUNE GLOBULIN 1500 UNIT/2ML IJ SOSY
300.0000 ug | PREFILLED_SYRINGE | Freq: Once | INTRAMUSCULAR | Status: AC
  Administered 2015-08-01: 300 ug via INTRAVENOUS
  Filled 2015-08-01: qty 2

## 2015-08-01 NOTE — Discharge Summary (Signed)
Obstetric Discharge Summary Reason for Admission: induction of labor Prenatal Procedures: NST and ultrasound Intrapartum Procedures: spontaneous vaginal delivery Postpartum Procedures: Rho(D) Ig Complications-Operative and Postpartum: none HEMOGLOBIN  Date Value Ref Range Status  08/01/2015 10.3* 12.0 - 15.0 g/dL Final   HCT  Date Value Ref Range Status  08/01/2015 31.2* 36.0 - 46.0 % Final    Physical Exam:  General: alert, cooperative and appears stated age 12Lochia: appropriate Uterine Fundus: firm DVT Evaluation: No evidence of DVT seen on physical exam.  Discharge Diagnoses: Term Pregnancy-delivered  Discharge Information: Date: 08/01/2015 Activity: pelvic rest Diet: routine Medications: PNV and Ibuprofen Condition: stable Instructions: refer to practice specific booklet Discharge to: home Follow-up Information    Follow up with HORVATH,MICHELLE A, MD In 4 weeks.   Specialty:  Obstetrics and Gynecology   Contact information:   [address] [place] [address] [phone]       Newborn Data: Live born female  Birth Weight: 6 lb 12.3 oz (3070 g) APGAR: 9, 9  Home with mother.  Ashlee Anderson Ashlee Anderson 08/01/2015, 8:46 AM

## 2015-08-01 NOTE — Lactation Note (Signed)
This note was copied from a baby's chart. Lactation Consultation Note Experienced BF mom, BF for 8 months formula and breast. Mom plans to do the same with this baby. Mom had baby to breast when entered rm. In cradle position. Mom had requested formula d/t plans to formula and breast feed. Formula information sheet given by RN. encouraged to BF first then supplement.  Educated about newborn behavior, cluster feeding, I&O, supply and demand. Mom encouraged to feed baby 8-12 times/24 hours and with feeding cues. Referred to Baby and Me Book in Breastfeeding section Pg. 22-23 for position options and Proper latch demonstration. WH/LC brochure given w/resources, support groups and LC services. Patient Name: Girl Ricard DillonDionka Schmutz JYNWG'NToday's Date: 08/01/2015 Reason for consult: Initial assessment   Maternal Data Formula Feeding for Exclusion: No Has patient been taught Hand Expression?: Yes Does the patient have breastfeeding experience prior to this delivery?: Yes  Feeding Feeding Type: Breast Fed Length of feed: 20 min  LATCH Score/Interventions Latch: Repeated attempts needed to sustain latch, nipple held in mouth throughout feeding, stimulation needed to elicit sucking reflex. Intervention(s): Adjust position;Assist with latch  Audible Swallowing: A few with stimulation Intervention(s): Skin to skin;Hand expression;Alternate breast massage  Type of Nipple: Everted at rest and after stimulation  Comfort (Breast/Nipple): Soft / non-tender     Hold (Positioning): Assistance needed to correctly position infant at breast and maintain latch. Intervention(s): Breastfeeding basics reviewed;Support Pillows;Position options;Skin to skin  LATCH Score: 7  Lactation Tools Discussed/Used WIC Program: No   Consult Status Consult Status: Follow-up Date: 08/02/15 Follow-up type: In-patient    Charyl DancerCARVER, Normajean Nash G 08/01/2015, 3:47 AM

## 2015-08-01 NOTE — Progress Notes (Signed)
Patient is doing well.  She is ambulating, voiding, tolerating PO.  Pain control is good.  Lochia is appropriate  Filed Vitals:   07/31/15 1703 07/31/15 1715 07/31/15 2215 08/01/15 0705  BP: 139/99 128/74 120/65 117/71  Pulse: 115 84 67 73  Temp:  98.2 F (36.8 C) 98.2 F (36.8 C) 97.9 F (36.6 C)  TempSrc:  Oral Oral Oral  Resp:  20 20 20   Height:      Weight:        NAD Fundus firm Ext: no edema  Lab Results  Component Value Date   WBC 13.7* 08/01/2015   HGB 10.3* 08/01/2015   HCT 31.2* 08/01/2015   MCV 76.5* 08/01/2015   PLT 370 08/01/2015    --/--/B NEG (07/05 0735)/RImmune  A/P 34 y.o. G2P2001 PPD#1 s/p TSVD . Routine care.   Meeting all goals, desires discharge today at 24h Rh neg--baby is B+.  Will require rhogam prior to discharge   Santi[facility] HospitalDYANNA GEFFEL [place]ARK

## 2015-08-02 LAB — RH IG WORKUP (INCLUDES ABO/RH)
ABO/RH(D): B NEG
FETAL SCREEN: NEGATIVE
GESTATIONAL AGE(WKS): 37.4
Unit division: 0

## 2015-08-03 LAB — TYPE AND SCREEN
ABO/RH(D): B NEG
ANTIBODY SCREEN: POSITIVE
DAT, IGG: NEGATIVE
Unit division: 0
Unit division: 0

## 2018-02-28 DIAGNOSIS — J069 Acute upper respiratory infection, unspecified: Secondary | ICD-10-CM | POA: Diagnosis not present

## 2021-05-14 DIAGNOSIS — M79672 Pain in left foot: Secondary | ICD-10-CM | POA: Diagnosis not present

## 2021-05-14 DIAGNOSIS — M79671 Pain in right foot: Secondary | ICD-10-CM | POA: Diagnosis not present

## 2021-06-27 DIAGNOSIS — M79671 Pain in right foot: Secondary | ICD-10-CM | POA: Diagnosis not present

## 2021-08-12 DIAGNOSIS — M958 Other specified acquired deformities of musculoskeletal system: Secondary | ICD-10-CM | POA: Diagnosis not present

## 2021-08-12 DIAGNOSIS — M7731 Calcaneal spur, right foot: Secondary | ICD-10-CM | POA: Diagnosis not present

## 2021-08-12 DIAGNOSIS — M7661 Achilles tendinitis, right leg: Secondary | ICD-10-CM | POA: Diagnosis not present

## 2021-09-10 DIAGNOSIS — M7661 Achilles tendinitis, right leg: Secondary | ICD-10-CM | POA: Diagnosis not present

## 2021-10-10 DIAGNOSIS — Z0000 Encounter for general adult medical examination without abnormal findings: Secondary | ICD-10-CM | POA: Diagnosis not present

## 2021-10-10 DIAGNOSIS — Z23 Encounter for immunization: Secondary | ICD-10-CM | POA: Diagnosis not present

## 2021-10-10 DIAGNOSIS — Z13220 Encounter for screening for lipoid disorders: Secondary | ICD-10-CM | POA: Diagnosis not present

## 2021-10-10 DIAGNOSIS — Z131 Encounter for screening for diabetes mellitus: Secondary | ICD-10-CM | POA: Diagnosis not present

## 2021-10-13 DIAGNOSIS — F4321 Adjustment disorder with depressed mood: Secondary | ICD-10-CM | POA: Diagnosis not present

## 2021-10-28 DIAGNOSIS — F4321 Adjustment disorder with depressed mood: Secondary | ICD-10-CM | POA: Diagnosis not present

## 2021-11-03 DIAGNOSIS — F4321 Adjustment disorder with depressed mood: Secondary | ICD-10-CM | POA: Diagnosis not present

## 2021-12-02 DIAGNOSIS — Z113 Encounter for screening for infections with a predominantly sexual mode of transmission: Secondary | ICD-10-CM | POA: Diagnosis not present

## 2021-12-02 DIAGNOSIS — N760 Acute vaginitis: Secondary | ICD-10-CM | POA: Diagnosis not present

## 2021-12-02 DIAGNOSIS — Z01419 Encounter for gynecological examination (general) (routine) without abnormal findings: Secondary | ICD-10-CM | POA: Diagnosis not present

## 2021-12-17 DIAGNOSIS — Z1231 Encounter for screening mammogram for malignant neoplasm of breast: Secondary | ICD-10-CM | POA: Diagnosis not present

## 2021-12-17 DIAGNOSIS — R1032 Left lower quadrant pain: Secondary | ICD-10-CM | POA: Diagnosis not present

## 2022-10-16 DIAGNOSIS — Z0000 Encounter for general adult medical examination without abnormal findings: Secondary | ICD-10-CM | POA: Diagnosis not present

## 2022-10-16 DIAGNOSIS — Z23 Encounter for immunization: Secondary | ICD-10-CM | POA: Diagnosis not present

## 2022-10-16 DIAGNOSIS — Z131 Encounter for screening for diabetes mellitus: Secondary | ICD-10-CM | POA: Diagnosis not present

## 2022-10-16 DIAGNOSIS — Z13220 Encounter for screening for lipoid disorders: Secondary | ICD-10-CM | POA: Diagnosis not present

## 2023-10-16 DIAGNOSIS — J029 Acute pharyngitis, unspecified: Secondary | ICD-10-CM | POA: Diagnosis not present

## 2023-10-16 DIAGNOSIS — J020 Streptococcal pharyngitis: Secondary | ICD-10-CM | POA: Diagnosis not present

## 2023-10-18 DIAGNOSIS — Z0000 Encounter for general adult medical examination without abnormal findings: Secondary | ICD-10-CM | POA: Diagnosis not present

## 2023-10-18 DIAGNOSIS — Z131 Encounter for screening for diabetes mellitus: Secondary | ICD-10-CM | POA: Diagnosis not present

## 2023-10-18 DIAGNOSIS — Z13220 Encounter for screening for lipoid disorders: Secondary | ICD-10-CM | POA: Diagnosis not present

## 2023-10-18 DIAGNOSIS — Z23 Encounter for immunization: Secondary | ICD-10-CM | POA: Diagnosis not present
